# Patient Record
Sex: Male | Born: 1975 | Race: Black or African American | Hispanic: No | Marital: Single | Smoking: Never smoker
Health system: Southern US, Community
[De-identification: ages and names within clinical notes are randomized; demographics above are authoritative.]

## PROBLEM LIST (undated history)

## (undated) DIAGNOSIS — I10 Essential (primary) hypertension: Secondary | ICD-10-CM

---

## 2004-10-14 ENCOUNTER — Emergency Department (HOSPITAL_COMMUNITY): Admission: EM | Admit: 2004-10-14 | Discharge: 2004-10-14 | Payer: Self-pay | Admitting: Emergency Medicine

## 2004-11-03 ENCOUNTER — Emergency Department (HOSPITAL_COMMUNITY): Admission: EM | Admit: 2004-11-03 | Discharge: 2004-11-03 | Payer: Self-pay | Admitting: Emergency Medicine

## 2007-09-11 ENCOUNTER — Emergency Department (HOSPITAL_BASED_OUTPATIENT_CLINIC_OR_DEPARTMENT_OTHER): Admission: EM | Admit: 2007-09-11 | Discharge: 2007-09-11 | Payer: Self-pay | Admitting: Emergency Medicine

## 2007-11-09 ENCOUNTER — Emergency Department (HOSPITAL_BASED_OUTPATIENT_CLINIC_OR_DEPARTMENT_OTHER): Admission: EM | Admit: 2007-11-09 | Discharge: 2007-11-09 | Payer: Self-pay | Admitting: Emergency Medicine

## 2008-05-05 ENCOUNTER — Emergency Department (HOSPITAL_BASED_OUTPATIENT_CLINIC_OR_DEPARTMENT_OTHER): Admission: EM | Admit: 2008-05-05 | Discharge: 2008-05-05 | Payer: Self-pay | Admitting: Emergency Medicine

## 2008-07-07 ENCOUNTER — Emergency Department (HOSPITAL_BASED_OUTPATIENT_CLINIC_OR_DEPARTMENT_OTHER): Admission: EM | Admit: 2008-07-07 | Discharge: 2008-07-07 | Payer: Self-pay | Admitting: Emergency Medicine

## 2009-02-27 ENCOUNTER — Encounter: Payer: Self-pay | Admitting: Emergency Medicine

## 2009-02-27 ENCOUNTER — Ambulatory Visit: Payer: Self-pay | Admitting: Diagnostic Radiology

## 2009-02-27 ENCOUNTER — Ambulatory Visit: Payer: Self-pay | Admitting: Internal Medicine

## 2009-02-27 ENCOUNTER — Emergency Department (HOSPITAL_COMMUNITY): Admission: EM | Admit: 2009-02-27 | Discharge: 2009-02-27 | Payer: Self-pay | Admitting: Emergency Medicine

## 2010-03-25 LAB — POCT CARDIAC MARKERS
CKMB, poc: <1 ng/mL — ABNORMAL LOW (ref 1.0–8.0)
Myoglobin, poc: 79.2 ng/mL (ref 12–200)
Troponin i, poc: <0.05 ng/mL (ref 0.00–0.09)

## 2010-03-25 LAB — CBC
HCT: 44.3 % (ref 39.0–52.0)
Hemoglobin: 15.2 g/dL (ref 13.0–17.0)
MCHC: 34.2 g/dL (ref 30.0–36.0)
MCV: 92.1 fL (ref 78.0–100.0)
Platelets: 215 10*3/uL (ref 150–400)
RBC: 4.82 MIL/uL (ref 4.22–5.81)
RDW: 11.9 % (ref 11.5–15.5)
WBC: 8.7 10*3/uL (ref 4.0–10.5)

## 2010-03-25 LAB — POCT TOXICOLOGY PANEL

## 2010-03-25 LAB — DIFFERENTIAL
Basophils Absolute: 0 10*3/uL (ref 0.0–0.1)
Basophils Relative: 1 % (ref 0–1)
Eosinophils Absolute: 0.2 10*3/uL (ref 0.0–0.7)
Eosinophils Relative: 3 % (ref 0–5)
Lymphocytes Relative: 21 % (ref 12–46)
Lymphs Abs: 1.8 10*3/uL (ref 0.7–4.0)
Monocytes Absolute: 0.5 10*3/uL (ref 0.1–1.0)
Monocytes Relative: 6 % (ref 3–12)
Neutro Abs: 6.2 10*3/uL (ref 1.7–7.7)
Neutrophils Relative %: 69 % (ref 43–77)

## 2010-03-25 LAB — BASIC METABOLIC PANEL
BUN: 10 mg/dL (ref 6–23)
CO2: 32 mEq/L (ref 19–32)
Calcium: 9.3 mg/dL (ref 8.4–10.5)
Chloride: 104 mEq/L (ref 96–112)
Creatinine, Ser: 1 mg/dL (ref 0.4–1.5)
GFR calc Af Amer: >60 mL/min (ref >60)
GFR calc non Af Amer: >60 mL/min (ref >60)
Glucose, Bld: 100 mg/dL — ABNORMAL HIGH (ref 70–99)
Potassium: 4 mEq/L (ref 3.5–5.1)
Sodium: 144 mEq/L (ref 135–145)

## 2010-04-14 LAB — POCT CARDIAC MARKERS
CKMB, poc: <1 ng/mL — ABNORMAL LOW (ref 1.0–8.0)
Myoglobin, poc: 44.3 ng/mL (ref 12–200)
Troponin i, poc: <0.05 ng/mL (ref 0.00–0.09)

## 2010-04-14 LAB — GLUCOSE, CAPILLARY
Glucose-Capillary: 107 mg/dL — ABNORMAL HIGH (ref 70–99)
Glucose-Capillary: 109 mg/dL — ABNORMAL HIGH (ref 70–99)

## 2010-04-14 LAB — DIFFERENTIAL
Basophils Absolute: 0 10*3/uL (ref 0.0–0.1)
Basophils Relative: 1 % (ref 0–1)
Eosinophils Absolute: 0.1 10*3/uL (ref 0.0–0.7)
Eosinophils Relative: 1 % (ref 0–5)
Lymphocytes Relative: 24 % (ref 12–46)
Lymphs Abs: 1.7 10*3/uL (ref 0.7–4.0)
Monocytes Absolute: 0.5 10*3/uL (ref 0.1–1.0)
Monocytes Relative: 7 % (ref 3–12)
Neutro Abs: 4.8 10*3/uL (ref 1.7–7.7)
Neutrophils Relative %: 68 % (ref 43–77)

## 2010-04-14 LAB — CBC
HCT: 44.3 % (ref 39.0–52.0)
Hemoglobin: 14.8 g/dL (ref 13.0–17.0)
MCHC: 33.4 g/dL (ref 30.0–36.0)
MCV: 93.8 fL (ref 78.0–100.0)
Platelets: 164 10*3/uL (ref 150–400)
RBC: 4.73 MIL/uL (ref 4.22–5.81)
RDW: 11.9 % (ref 11.5–15.5)
WBC: 7.1 10*3/uL (ref 4.0–10.5)

## 2010-04-14 LAB — BASIC METABOLIC PANEL
BUN: 22 mg/dL (ref 6–23)
CO2: 27 mEq/L (ref 19–32)
Calcium: 8.8 mg/dL (ref 8.4–10.5)
Chloride: 105 mEq/L (ref 96–112)
Creatinine, Ser: 1 mg/dL (ref 0.4–1.5)
GFR calc Af Amer: >60 mL/min (ref >60)
GFR calc non Af Amer: >60 mL/min (ref >60)
Glucose, Bld: 95 mg/dL (ref 70–99)
Potassium: 4.7 mEq/L (ref 3.5–5.1)
Sodium: 140 mEq/L (ref 135–145)

## 2013-02-13 ENCOUNTER — Encounter (HOSPITAL_BASED_OUTPATIENT_CLINIC_OR_DEPARTMENT_OTHER): Payer: Self-pay | Admitting: Emergency Medicine

## 2013-02-13 ENCOUNTER — Emergency Department (HOSPITAL_BASED_OUTPATIENT_CLINIC_OR_DEPARTMENT_OTHER): Payer: BC Managed Care – PPO

## 2013-02-13 ENCOUNTER — Emergency Department (HOSPITAL_BASED_OUTPATIENT_CLINIC_OR_DEPARTMENT_OTHER)
Admission: EM | Admit: 2013-02-13 | Discharge: 2013-02-13 | Disposition: A | Discharge status: Home / Self Care | Payer: BC Managed Care – PPO | Attending: Emergency Medicine | Admitting: Emergency Medicine

## 2013-02-13 DIAGNOSIS — R42 Dizziness and giddiness: Secondary | ICD-10-CM | POA: Insufficient documentation

## 2013-02-13 DIAGNOSIS — G43009 Migraine without aura, not intractable, without status migrainosus: Secondary | ICD-10-CM

## 2013-02-13 DIAGNOSIS — R209 Unspecified disturbances of skin sensation: Secondary | ICD-10-CM | POA: Insufficient documentation

## 2013-02-13 DIAGNOSIS — G43809 Other migraine, not intractable, without status migrainosus: Secondary | ICD-10-CM | POA: Insufficient documentation

## 2013-02-13 MED ORDER — DEXAMETHASONE SODIUM PHOSPHATE 10 MG/ML IJ SOLN
10.0000 mg | Freq: Once | INTRAMUSCULAR | Status: AC
  Administered 2013-02-13: 10 mg via INTRAVENOUS
  Filled 2013-02-13: qty 1

## 2013-02-13 MED ORDER — DIPHENHYDRAMINE HCL 50 MG/ML IJ SOLN
25.0000 mg | Freq: Once | INTRAMUSCULAR | Status: AC
  Administered 2013-02-13: 25 mg via INTRAVENOUS
  Filled 2013-02-13: qty 1

## 2013-02-13 MED ORDER — METOCLOPRAMIDE HCL 5 MG/ML IJ SOLN
10.0000 mg | Freq: Once | INTRAMUSCULAR | Status: AC
  Administered 2013-02-13: 10 mg via INTRAVENOUS
  Filled 2013-02-13: qty 2

## 2013-02-13 MED ORDER — TETRACAINE HCL 0.5 % OP SOLN
1.0000 [drp] | Freq: Once | OPHTHALMIC | Status: AC
  Administered 2013-02-13: 2 [drp] via OPHTHALMIC
  Filled 2013-02-13: qty 2

## 2013-02-13 NOTE — ED Notes (Signed)
Patient transported to CT 

## 2013-02-13 NOTE — ED Provider Notes (Signed)
CSN: [id number]     Arrival date & time 02/13/13  1916 History   First MD Initiated Contact with Patient 02/13/13 1931     Chief Complaint  Patient presents with  . Headache     (Consider location/radiation/quality/duration/timing/severity/associated sxs/prior Treatment) HPI Comments: Patient with no PMHx presents today with complaints of numbness to the right side of his head, dizziness with standing and turning his head, right eye pain and pressure, and pressure in his head.  He also reports halos around lights in his right eye only.  He denies fever, chills, neck stiffness, nausea, vomiting, neck pain, chest pain, shortness of breath, focal weakness, numbness or tingling in his extremities, dysuria, recent URI.  Patient is a 38 y.o. male presenting with headaches. The history is provided by the patient. No language interpreter was used.  Headache Associated symptoms: dizziness and numbness     History reviewed. No pertinent past medical history. History reviewed. No pertinent past surgical history. History reviewed. No pertinent family history. History  Substance Use Topics  . Smoking status: Never Smoker   . Smokeless tobacco: Not on file  . Alcohol Use: No    Review of Systems  Neurological: Positive for dizziness, numbness and headaches. Negative for syncope, facial asymmetry, speech difficulty and weakness.  All other systems reviewed and are negative.      Allergies  Review of patient's allergies indicates no known allergies.  Home Medications  No current outpatient prescriptions on file. BP 130/78  Pulse 66  Temp(Src) 98.6 F (37 C) (Oral)  Resp 16  Ht 6' (1.829 m)  Wt 212 lb (96.163 kg)  BMI 28.75 kg/m2  SpO2 99% Physical Exam  Nursing note and vitals reviewed. Constitutional: He is oriented to person, place, and time. He appears well-developed and well-nourished. No distress.  HENT:  Head: Normocephalic and atraumatic.  Right Ear: External ear normal.   Left Ear: External ear normal.  Nose: Nose normal.  Mouth/Throat: Oropharynx is clear and moist. No oropharyngeal exudate.  Reports decrease in sensation to right scalp, face.  Eyes: Conjunctivae and EOM are normal. Pupils are equal, round, and reactive to light. Right eye exhibits no discharge. Left eye exhibits no discharge. No scleral icterus.  IOP in right eye 8 and 9, no proptosis, no ptosis  Neck: Normal range of motion. Neck supple.  Cardiovascular: Normal rate, regular rhythm and normal heart sounds.  Exam reveals no gallop and no friction rub.   No murmur heard. Pulmonary/Chest: Effort normal and breath sounds normal. No respiratory distress. He has no wheezes. He has no rales. He exhibits no tenderness.  Abdominal: Soft. Bowel sounds are normal. He exhibits no distension. There is no tenderness.  Musculoskeletal: Normal range of motion. He exhibits no edema and no tenderness.  Lymphadenopathy:    He has no cervical adenopathy.  Neurological: He is alert and oriented to person, place, and time. He has normal strength. He displays no atrophy. A sensory deficit is present. No cranial nerve deficit. He exhibits normal muscle tone. He displays a negative Romberg sign. Coordination and gait normal. GCS eye subscore is 4. GCS verbal subscore is 5. GCS motor subscore is 6.  Reflex Scores:      Tricep reflexes are 2+ on the right side and 2+ on the left side.      Bicep reflexes are 2+ on the right side and 2+ on the left side.      Patellar reflexes are 2+ on the right side and 2+  on the left side.      Achilles reflexes are 2+ on the right side and 2+ on the left side. Skin: Skin is warm and dry. No rash noted. No erythema. No pallor.  Psychiatric: He has a normal mood and affect. His behavior is normal. Judgment and thought content normal.    ED Course  Procedures (including critical care time) Labs Review Labs Reviewed - No data to display Imaging Review Ct Head Wo  Contrast  02/13/2013   CLINICAL DATA:  Headache and dizziness.  EXAM: CT HEAD WITHOUT CONTRAST  TECHNIQUE: Contiguous axial images were obtained from the base of the skull through the vertex without intravenous contrast.  COMPARISON:  Head CT scan 11/09/2007.  FINDINGS: The brain appears normal without infarct, hemorrhage, mass lesion, mass effect, midline shift or abnormal extra-axial fluid collection. No hydrocephalus or pneumocephalus. The calvarium is intact.  IMPRESSION: Negative head CT.   Electronically Signed   By: Drusilla Kanner M.D.   On: 02/13/2013 20:46    EKG Interpretation   None      Medications  tetracaine (PONTOCAINE) 0.5 % ophthalmic solution 1-2 drop (2 drops Right Eye Given 02/13/13 1952)  metoCLOPramide (REGLAN) injection 10 mg (10 mg Intravenous Given 02/13/13 2117)  diphenhydrAMINE (BENADRYL) injection 25 mg (25 mg Intravenous Given 02/13/13 2115)  dexamethasone (DECADRON) injection 10 mg (10 mg Intravenous Given 02/13/13 2120)     MDM   Final diagnoses:  Atypical migraine    Patient here with normal head ct and neuro examination (besides subjective sensory issues to his head), symptoms completely relieved with a migraine cocktail so I suspect this may actually be an atypical migraine.  Patient will follow up with his PCP.  Initially slightly hypertensive, but repeat blood pressure 138/90   Scarlette Calico C. Marisue Humble, PA-C 02/13/13 2206

## 2013-02-13 NOTE — ED Notes (Addendum)
Dizziness, rt side head pressure,  Pt states feels like rt eye droop, pt also states feels as if words slurring  Dizziness when standing   Grips equal bilateral,  No facial droop noted,  Good hand eye movement   Denies inj   rn assessment normal

## 2013-02-13 NOTE — ED Notes (Signed)
Pt reports h/a with  Dizziness x 1 day also c/o right facial numbness

## 2013-02-13 NOTE — ED Provider Notes (Signed)
Medical screening examination/treatment/procedure(s) were performed by non-physician practitioner and as supervising physician I was immediately available for consultation/collaboration.  EKG Interpretation   None         Gwyneth SproutWhitney Havyn Ramo, MD 02/13/13 2233

## 2013-02-13 NOTE — Discharge Instructions (Signed)
Migraine Headache A migraine headache is an intense, throbbing pain on one or both sides of your head. A migraine can last for 30 minutes to several hours. CAUSES  The exact cause of a migraine headache is not always known. However, a migraine may be caused when nerves in the brain become irritated and release chemicals that cause inflammation. This causes pain. Certain things may also trigger migraines, such as:  Alcohol.  Smoking.  Stress.  Menstruation.  Aged cheeses.  Foods or drinks that contain nitrates, glutamate, aspartame, or tyramine.  Lack of sleep.  Chocolate.  Caffeine.  Hunger.  Physical exertion.  Fatigue.  Medicines used to treat chest pain (nitroglycerine), birth control pills, estrogen, and some blood pressure medicines. SIGNS AND SYMPTOMS  Pain on one or both sides of your head.  Pulsating or throbbing pain.  Severe pain that prevents daily activities.  Pain that is aggravated by any physical activity.  Nausea, vomiting, or both.  Dizziness.  Pain with exposure to bright lights, loud noises, or activity.  General sensitivity to bright lights, loud noises, or smells. Before you get a migraine, you may get warning signs that a migraine is coming (aura). An aura may include:  Seeing flashing lights.  Seeing bright spots, halos, or zig-zag lines.  Having tunnel vision or blurred vision.  Having feelings of numbness or tingling.  Having trouble talking.  Having muscle weakness. DIAGNOSIS  A migraine headache is often diagnosed based on:  Symptoms.  Physical exam.  A CT scan or MRI of your head. These imaging tests cannot diagnose migraines, but they can help rule out other causes of headaches. TREATMENT Medicines may be given for pain and nausea. Medicines can also be given to help prevent recurrent migraines.  HOME CARE INSTRUCTIONS  Only take over-the-counter or prescription medicines for pain or discomfort as directed by your  health care provider. The use of long-term narcotics is not recommended.  Lie down in a dark, quiet room when you have a migraine.  Keep a journal to find out what may trigger your migraine headaches. For example, write down:  What you eat and drink.  How much sleep you get.  Any change to your diet or medicines.  Limit alcohol consumption.  Quit smoking if you smoke.  Get 7 9 hours of sleep, or as recommended by your health care provider.  Limit stress.  Keep lights dim if bright lights bother you and make your migraines worse. SEEK IMMEDIATE MEDICAL CARE IF:   Your migraine becomes severe.  You have a fever.  You have a stiff neck.  You have vision loss.  You have muscular weakness or loss of muscle control.  You start losing your balance or have trouble walking.  You feel faint or pass out.  You have severe symptoms that are different from your first symptoms. MAKE SURE YOU:   Understand these instructions.  Will watch your condition.  Will get help right away if you are not doing well or get worse. Document Released: 12/21/2004 Document Revised: 10/11/2012 Document Reviewed: 08/28/2012 [facility] Patient Information 2014 [place], [place].  Abdominal Migraine Abdominal migraine is one of several types of migraine. It is one example of "periodic syndrome". The periodic type more commonly occurs in children. Such children usually have a family history of migraine. Children may go on to develop typical migraines later in their lives.  SYMPTOMS  The attacks usually include intermittent periods of abdominal pain. Along with the abdominal pain, other symptoms may  occur such as:  Nausea.  Vomiting.  Intense blushing or reddening of the skin (flushing).  Pale appearance to the skin (pallor). DIAGNOSIS  Tests may be done to look for other possible conditions. TREATMENT  Medications that are useful in treating migraine may also work to control these attacks in  most children. Document Released: 03/13/2003 Document Revised: 04/17/2012 Document Reviewed: 08/09/2007 [facility] Patient Information 2014 [place], [place].

## 2013-02-13 NOTE — ED Notes (Signed)
Patient transported to X-ray 

## 2015-07-20 IMAGING — CT CT HEAD W/O CM
1 series · 16 of 30 positions shown, 20 images · non-contrast
Comparison: Head CT scan 11/09/2007.

CLINICAL DATA: Headache and dizziness.

EXAM:
CT HEAD WITHOUT CONTRAST
TECHNIQUE: Contiguous axial images were obtained from the base of the skull
through the vertex without intravenous contrast.

[Series 2: head 4.8 h37s · axial · 0.44mm/px · z∈[-146,-4]mm · 16 of 34 slices shown, 20 images]
[im 2/34  brain]
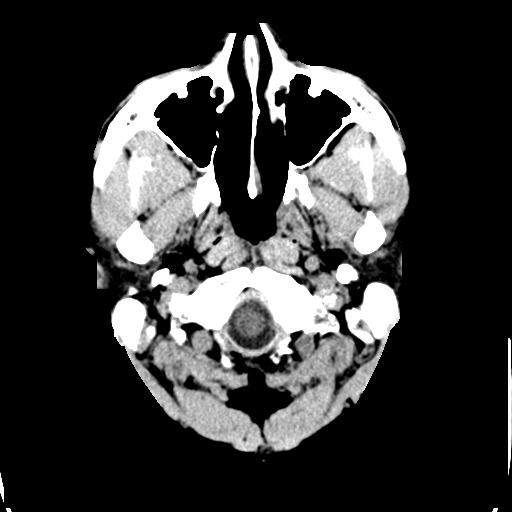
[im 2/34  bone]
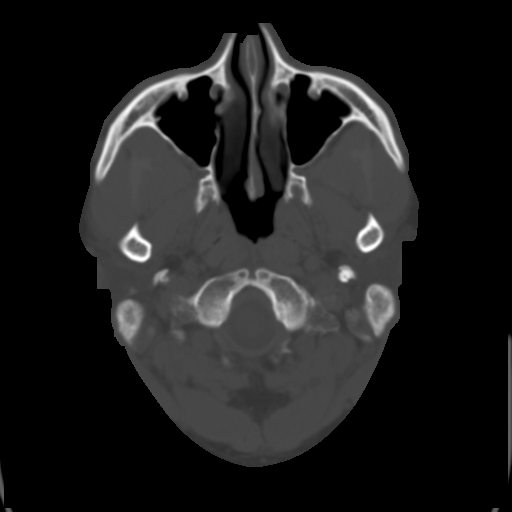
[im 4/34  brain]
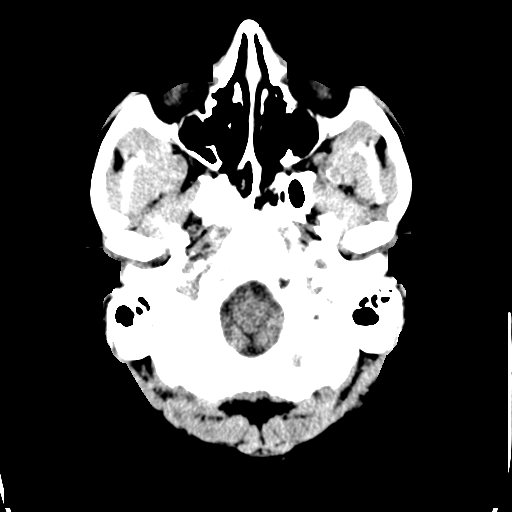
[im 6/34  brain]
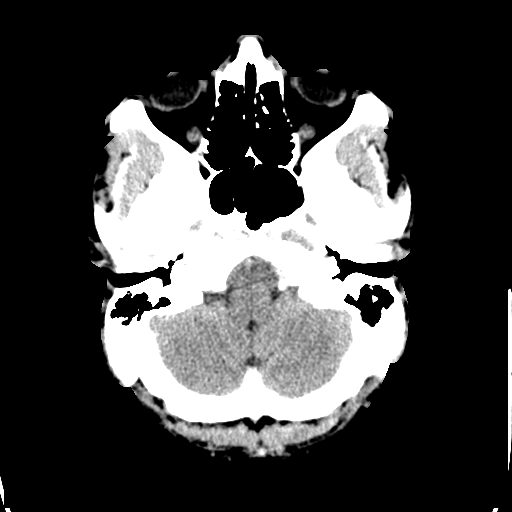
[im 8/34  brain]
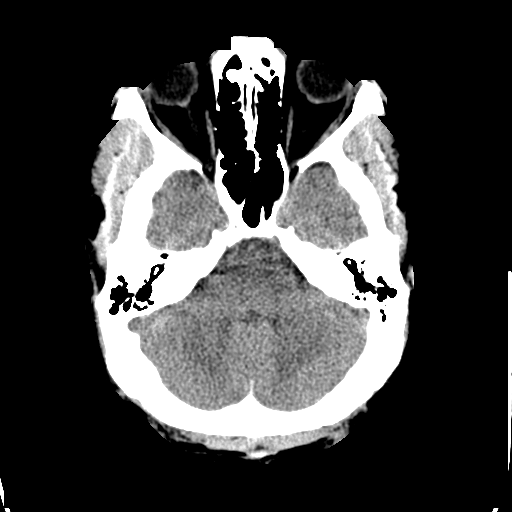
[im 10/34  brain]
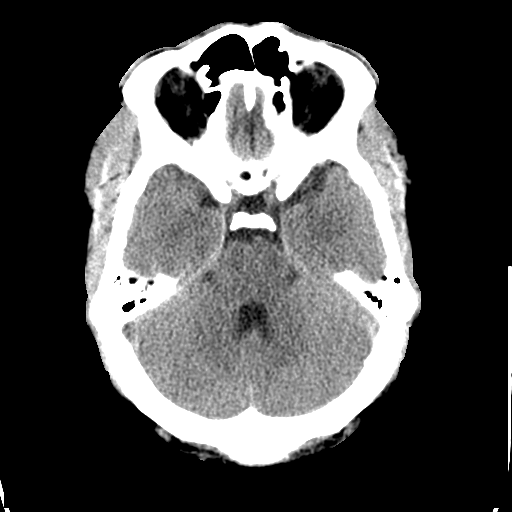
[im 10/34  bone]
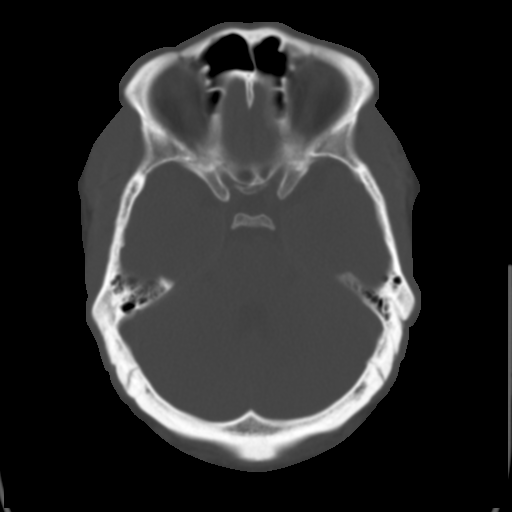
[im 12/34  brain]
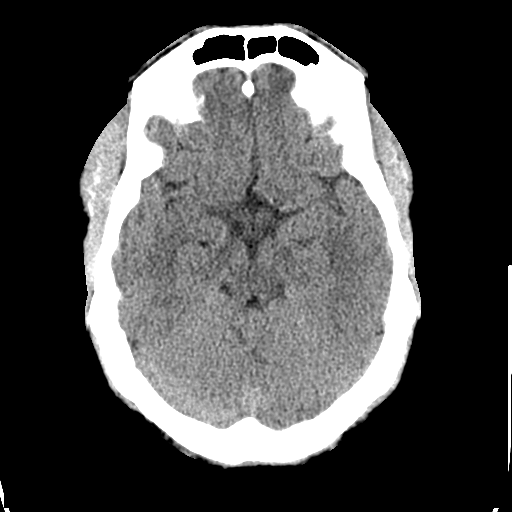
[im 14/34  brain]
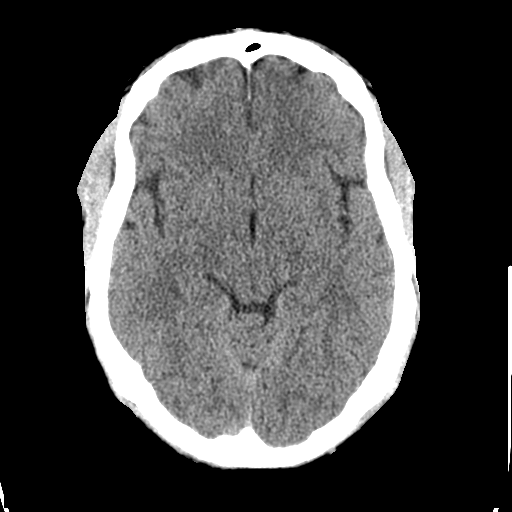
[im 16/34  brain]
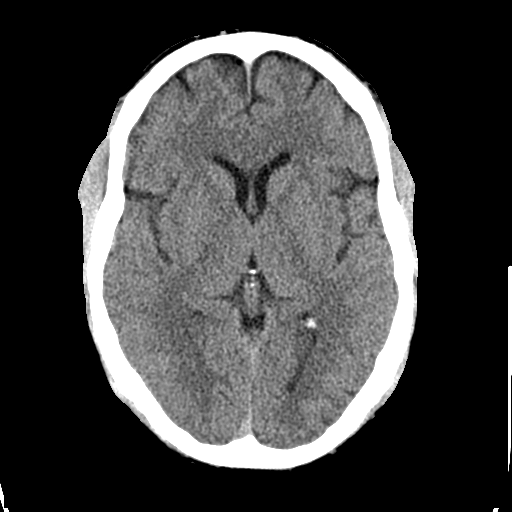
[im 18/34  brain]
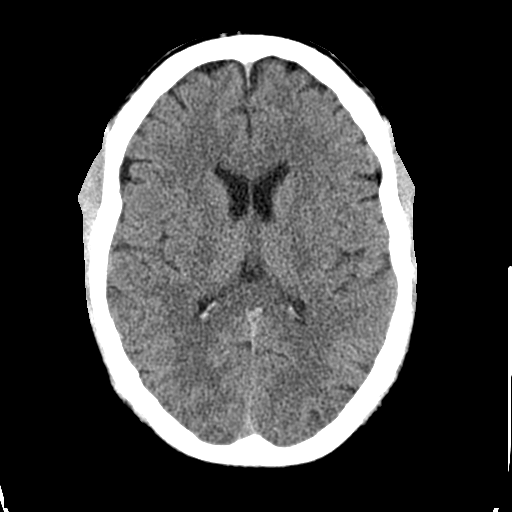
[im 18/34  bone]
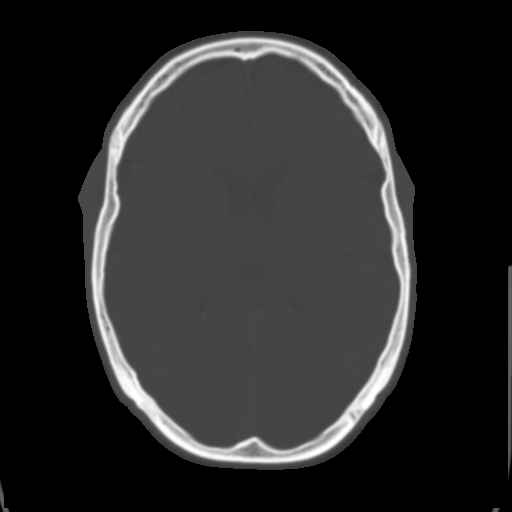
[im 20/34  brain]
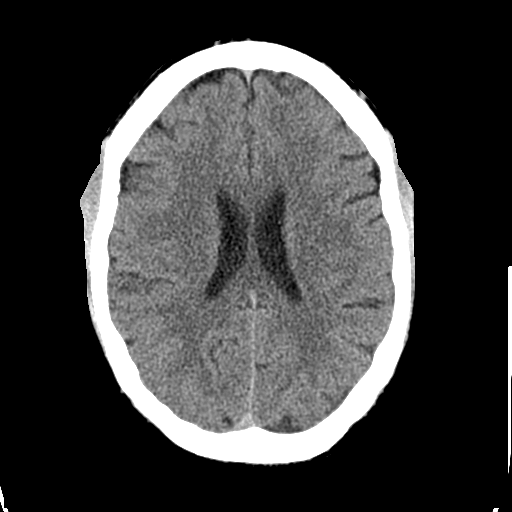
[im 22/34  brain]
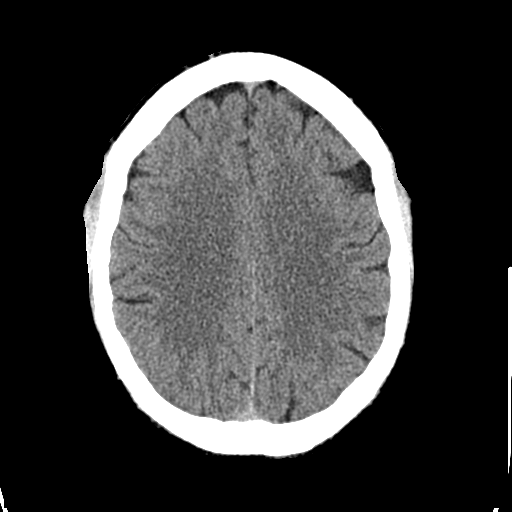
[im 24/34  brain]
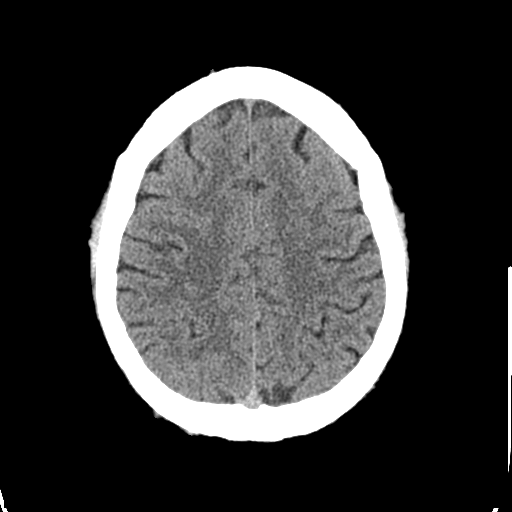
[im 26/34  brain]
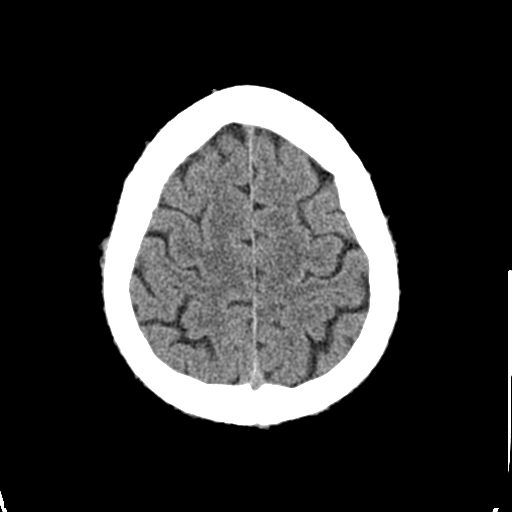
[im 26/34  bone]
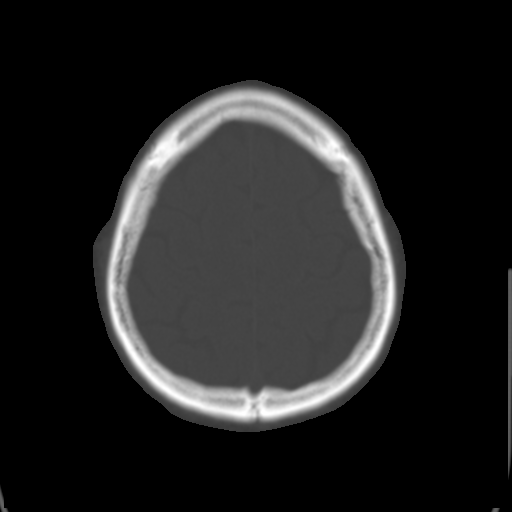
[im 28/34  brain]
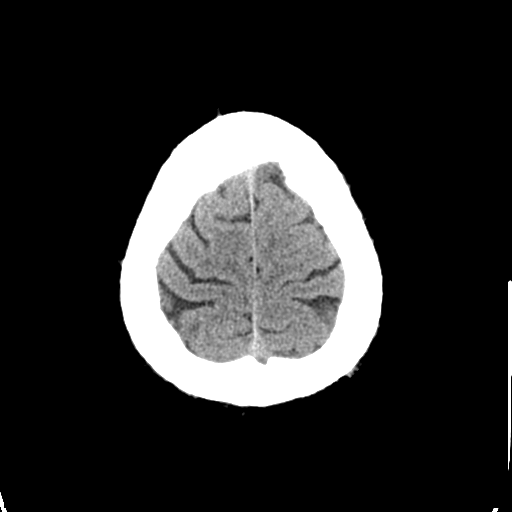
[im 30/34  brain]
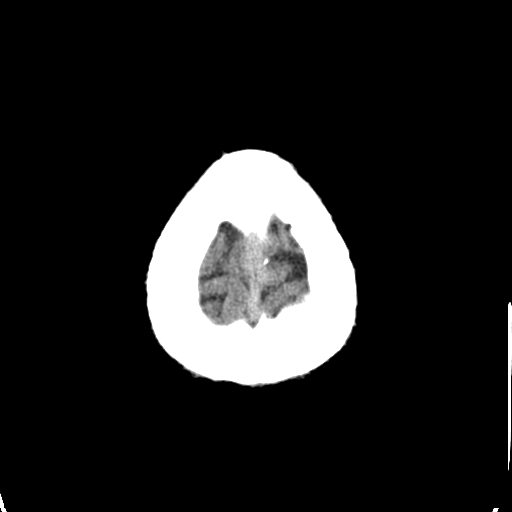
[im 32/34  brain]
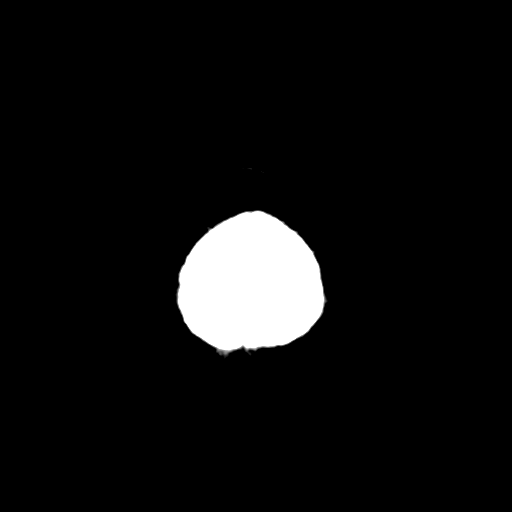

[16 of 30 positions shown; findings below may reference images not displayed]

FINDINGS: The brain appears normal without infarct, hemorrhage, mass lesion,
mass effect, midline shift or abnormal extra-axial fluid collection.
No hydrocephalus or pneumocephalus. The calvarium is intact.
IMPRESSION: Negative head CT.

## 2020-01-29 ENCOUNTER — Encounter (HOSPITAL_BASED_OUTPATIENT_CLINIC_OR_DEPARTMENT_OTHER): Payer: Self-pay | Admitting: *Deleted

## 2020-01-29 ENCOUNTER — Other Ambulatory Visit: Payer: Self-pay

## 2020-01-29 ENCOUNTER — Emergency Department (HOSPITAL_BASED_OUTPATIENT_CLINIC_OR_DEPARTMENT_OTHER): Payer: Managed Care, Other (non HMO)

## 2020-01-29 ENCOUNTER — Emergency Department (HOSPITAL_BASED_OUTPATIENT_CLINIC_OR_DEPARTMENT_OTHER)
Admission: EM | Admit: 2020-01-29 | Discharge: 2020-01-29 | Disposition: A | Discharge status: Home / Self Care | Payer: Managed Care, Other (non HMO) | Attending: Emergency Medicine | Admitting: Emergency Medicine

## 2020-01-29 DIAGNOSIS — I10 Essential (primary) hypertension: Secondary | ICD-10-CM | POA: Insufficient documentation

## 2020-01-29 DIAGNOSIS — Z79899 Other long term (current) drug therapy: Secondary | ICD-10-CM | POA: Insufficient documentation

## 2020-01-29 DIAGNOSIS — R079 Chest pain, unspecified: Secondary | ICD-10-CM | POA: Insufficient documentation

## 2020-01-29 HISTORY — DX: Essential (primary) hypertension: I10

## 2020-01-29 LAB — BASIC METABOLIC PANEL WITH GFR
Anion gap: 10 (ref 5–15)
BUN: 17 mg/dL (ref 6–20)
CO2: 21 mmol/L — ABNORMAL LOW (ref 22–32)
Calcium: 9 mg/dL (ref 8.9–10.3)
Chloride: 104 mmol/L (ref 98–111)
Creatinine, Ser: 1.08 mg/dL (ref 0.61–1.24)
GFR, Estimated: >60 mL/min
Glucose, Bld: 110 mg/dL — ABNORMAL HIGH (ref 70–99)
Potassium: 3.8 mmol/L (ref 3.5–5.1)
Sodium: 135 mmol/L (ref 135–145)

## 2020-01-29 LAB — TROPONIN I (HIGH SENSITIVITY)
Troponin I (High Sensitivity): 2 ng/L (ref <18)
Troponin I (High Sensitivity): 3 ng/L

## 2020-01-29 LAB — CBC
HCT: 39.7 % (ref 39.0–52.0)
Hemoglobin: 13.9 g/dL (ref 13.0–17.0)
MCH: 30.6 pg (ref 26.0–34.0)
MCHC: 35 g/dL (ref 30.0–36.0)
MCV: 87.4 fL (ref 80.0–100.0)
Platelets: 191 10*3/uL (ref 150–400)
RBC: 4.54 MIL/uL (ref 4.22–5.81)
RDW: 12 % (ref 11.5–15.5)
WBC: 6.8 10*3/uL (ref 4.0–10.5)
nRBC: 0 % (ref 0.0–0.2)

## 2020-01-29 LAB — D-DIMER, QUANTITATIVE (NOT AT ARMC): D-Dimer, Quant: <0.27 ug/mL-FEU (ref 0.00–0.50)

## 2020-01-29 MED ORDER — KETOROLAC TROMETHAMINE 30 MG/ML IJ SOLN
30.0000 mg | Freq: Once | INTRAMUSCULAR | Status: AC
  Administered 2020-01-29: 30 mg via INTRAVENOUS
  Filled 2020-01-29: qty 1

## 2020-01-29 NOTE — ED Provider Notes (Signed)
[facility] EMERGENCY DEPARTMENT Provider Note   CSN: [id number] Arrival date & time: 01/29/20  1244     History Chief Complaint  Patient presents with  . Chest Pain    Kyle Gallagher is a 45 y.o. male.  Kyle Gallagher is a 45 y.o. male with a history of hypertension, who presents to the ED for evaluation of chest pain.  He reports that this afternoon while at work he had a sudden onset of pain in the left side of his chest that radiated around to his left shoulder blade.  He reports pain has been constant, is worse with movement or deep breath.  He has had some chest pains previously but have not felt similar to this.  He reports when chest pain for started he felt short of breath but no longer feels this.  Denies any associated lightheadedness or syncope.  No lower extremity swelling or pain.  No tearing sensation or pain going down his back or into his abdomen.  No radiation to the arm neck or jaw.  No numbness weakness or tingling in extremities.  No associated nausea or vomiting.  No palpitations.  He has not taken anything for this pain.  Reports he recently saw his cardiologist, Dr. Ivan Croft with [facility] last week, and they have scheduled him for a stress echo because he has been having some dyspnea on exertion and palpitations, reports the pain he experienced today was different than the symptoms.  No history of smoking or other alcohol or drug use.  History of hypertension but no other cardiac risk factors.  No other aggravating or alleviating factors.        Past Medical History:  Diagnosis Date  . Hypertension     There are no problems to display for this patient.   History reviewed. No pertinent surgical history.     No family history on file.  Social History   Tobacco Use  . Smoking status: Never Smoker  . Smokeless tobacco: Never Used  Substance Use Topics  . Alcohol use: No  . Drug use: No    Home Medications Prior to Admission medications    Medication Sig Start Date End Date Taking? Authorizing Provider  losartan (COZAAR) 25 MG tablet Take by mouth.   Yes [provider]    Allergies    Patient has no known allergies.  Review of Systems   Review of Systems  Constitutional: Negative for chills and fever.  HENT: Negative.   Respiratory: Negative for cough and shortness of breath.   Cardiovascular: Positive for chest pain.  Gastrointestinal: Negative for abdominal pain, nausea and vomiting.  Musculoskeletal: Negative for arthralgias and myalgias.  Skin: Negative for color change and rash.  Neurological: Negative for dizziness, syncope and light-headedness.  All other systems reviewed and are negative.   Physical Exam Updated Vital Signs BP 126/90 (BP Location: Left Arm)   Pulse 67   Temp 98.7 F (37.1 C) (Oral)   Resp 18   Ht 6\' 1"  (1.854 m)   Wt 97.4 kg   SpO2 99%   BMI 28.33 kg/m   Physical Exam Vitals and nursing note reviewed.  Constitutional:      General: He is not in acute distress.    Appearance: He is well-developed, normal weight and well-nourished. He is not ill-appearing or diaphoretic.     Comments: Well-appearing and in no distress  HENT:     Head: Normocephalic and atraumatic.     Mouth/Throat:  Mouth: Oropharynx is clear and moist.  Eyes:     General:        Right eye: No discharge.        Left eye: No discharge.     Extraocular Movements: EOM normal.     Pupils: Pupils are equal, round, and reactive to light.  Cardiovascular:     Rate and Rhythm: Normal rate and regular rhythm.     Pulses: Intact distal pulses.          Radial pulses are 2+ on the right side and 2+ on the left side.       Dorsalis pedis pulses are 2+ on the right side and 2+ on the left side.       Posterior tibial pulses are 2+ on the right side and 2+ on the left side.     Heart sounds: Normal heart sounds. No murmur heard. No friction rub. No gallop.   Pulmonary:     Effort: Pulmonary effort is  normal. No respiratory distress.     Breath sounds: Normal breath sounds. No wheezing or rales.     Comments: Respirations equal and unlabored, patient able to speak in full sentences, lungs clear to auscultation bilaterally  Chest:     Chest wall: Tenderness present.  Abdominal:     General: Bowel sounds are normal. There is no distension.     Palpations: Abdomen is soft. There is no mass.     Tenderness: There is no abdominal tenderness. There is no guarding.     Comments: Abdomen soft, nondistended, nontender to palpation in all quadrants without guarding or peritoneal signs  Musculoskeletal:        General: No deformity or edema.     Cervical back: Neck supple.     Right lower leg: No tenderness. No edema.     Left lower leg: No tenderness. No edema.  Skin:    General: Skin is warm and dry.     Capillary Refill: Capillary refill takes less than 2 seconds.  Neurological:     Mental Status: He is alert.     Coordination: Coordination normal.     Comments: Speech is clear, able to follow commands Moves extremities without ataxia, coordination intact  Psychiatric:        Mood and Affect: Mood normal.        Behavior: Behavior normal.     ED Results / Procedures / Treatments   Labs (all labs ordered are listed, but only abnormal results are displayed) Labs Reviewed  BASIC METABOLIC PANEL - Abnormal; Notable for the following components:      Result Value   CO2 21 (*)    Glucose, Bld 110 (*)    All other components within normal limits  CBC  D-DIMER, QUANTITATIVE (NOT AT [facility])  TROPONIN I (HIGH SENSITIVITY)  TROPONIN I (HIGH SENSITIVITY)    EKG EKG Interpretation  Date/Time:  Tuesday January 29 2020 12:43:17 EST Ventricular Rate:  80 PR Interval:  162 QRS Duration: 84 QT Interval:  342 QTC Calculation: 394 R Axis:   32 Text Interpretation: Normal sinus rhythm Normal ECG No significant change was found Confirmed by Glynn Octave [phone]) on 01/29/2020 7:32:07  PM   Radiology DG Chest 2 View  Result Date: 01/29/2020 CLINICAL DATA:  Chest pain EXAM: CHEST - 2 VIEW COMPARISON:  February 27, 2009 FINDINGS: Lungs are clear. Heart size and pulmonary vascularity are normal. No adenopathy. No pneumothorax. No bone lesions. IMPRESSION: Lungs clear.  Heart  size normal. Electronically Signed   By: Bretta Bang III M.D.   On: 01/29/2020 13:03    Procedures Procedures   Medications Ordered in ED Medications  ketorolac (TORADOL) 30 MG/ML injection 30 mg (30 mg Intravenous Given 01/29/20 2147)    ED Course  I have reviewed the triage vital signs and the nursing notes.  Pertinent labs & imaging results that were available during my care of the patient were reviewed by me and considered in my medical decision making (see chart for details).    MDM Rules/Calculators/A&P                         Patient presents to the emergency department with chest pain. Patient nontoxic appearing, in no apparent distress, vitals without significant abnormality. Fairly benign physical exam.   Patient's primary cardiologist: Dr. Ivan Croft with [facility], has stress echo scheduled next month  DDX: ACS, pulmonary embolism, dissection, pneumothorax, pneumonia, arrhythmia, severe anemia, MSK, GERD, anxiety. Evaluation initiated with labs, EKG, and CXR. Patient on cardiac monitor.   I have independently ordered, reviewed and interpreted all labs and imaging:  CBC: No leukocytosis, normal hemoglobin BMP: CO2 of 21, glucose 110, no other electrolyte derangements and normal renal function Troponin: Negative x2 D-dimer: Negative EKG: Sinus rhythm, some mild ST changes in the inferior lateral leads, thought to likely be early repole, unchanged from prior EKGs CXR:  Negative, without infiltrate, effusion, pneumothorax, or fracture/dislocation.   Heart Pathway Score 3 - EKG without obvious acute ischemia, delta troponin negative, doubt ACS. D-dimer is negative, doubt  pulmonary embolism. Pain is not a tearing sensation, symmetric pulses, no widening of mediastinum on CXR, doubt dissection. Cardiac monitor reviewed, no notable arrhythmias or tachycardia. Patient has appeared hemodynamically stable throughout ER visit and appears safe for discharge with close PCP/cardiology follow up. I discussed results, treatment plan, need for PCP follow-up, and return precautions with the patient. Provided opportunity for questions, patient confirmed understanding and is in agreement with plan.    Final Clinical Impression(s) / ED Diagnoses Final diagnoses:  Left-sided chest pain    Rx / DC Orders ED Discharge Orders    None       Legrand Rams 01/29/20 2233    Glynn Octave, MD 01/30/20 1005

## 2020-01-29 NOTE — ED Notes (Signed)
Patient verbalizes understanding of discharge instructions. Opportunity for questioning and answers were provided. Armband removed by staff, pt discharged from ED ambulatory to home.  

## 2020-01-29 NOTE — Discharge Instructions (Signed)
You were seen in the emergency department today for chest pain. Your work-up in the emergency department has been overall reassuring. Your labs have been fairly normal and or similar to previous blood work you have had done. Your EKG and the enzyme we use to check your heart did not show an acute heart attack at this time. Your chest x-ray was normal.   Pain may be musculoskeletal in nature, you can use ibuprofen, Tylenol or Aleve to help with symptoms.  We would like you to follow up closely with your primary care provider and/or the cardiologist provided in your discharge instructions within 1-3 days. Return to the ER immediately should you experience any new or worsening symptoms including but not limited to return of pain, worsened pain, vomiting, shortness of breath, dizziness, lightheadedness, passing out, or any other concerns that you may have.

## 2020-01-29 NOTE — ED Triage Notes (Signed)
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 Pain in sharp in his left scapula with radiation anterior left chest.

## 2022-07-04 IMAGING — DX DG CHEST 2V
2 series · 2 of 2 positions shown · non-contrast
Comparison: February 27, 2009

CLINICAL DATA: Chest pain

EXAM:
CHEST - 2 VIEW

[chest pa]
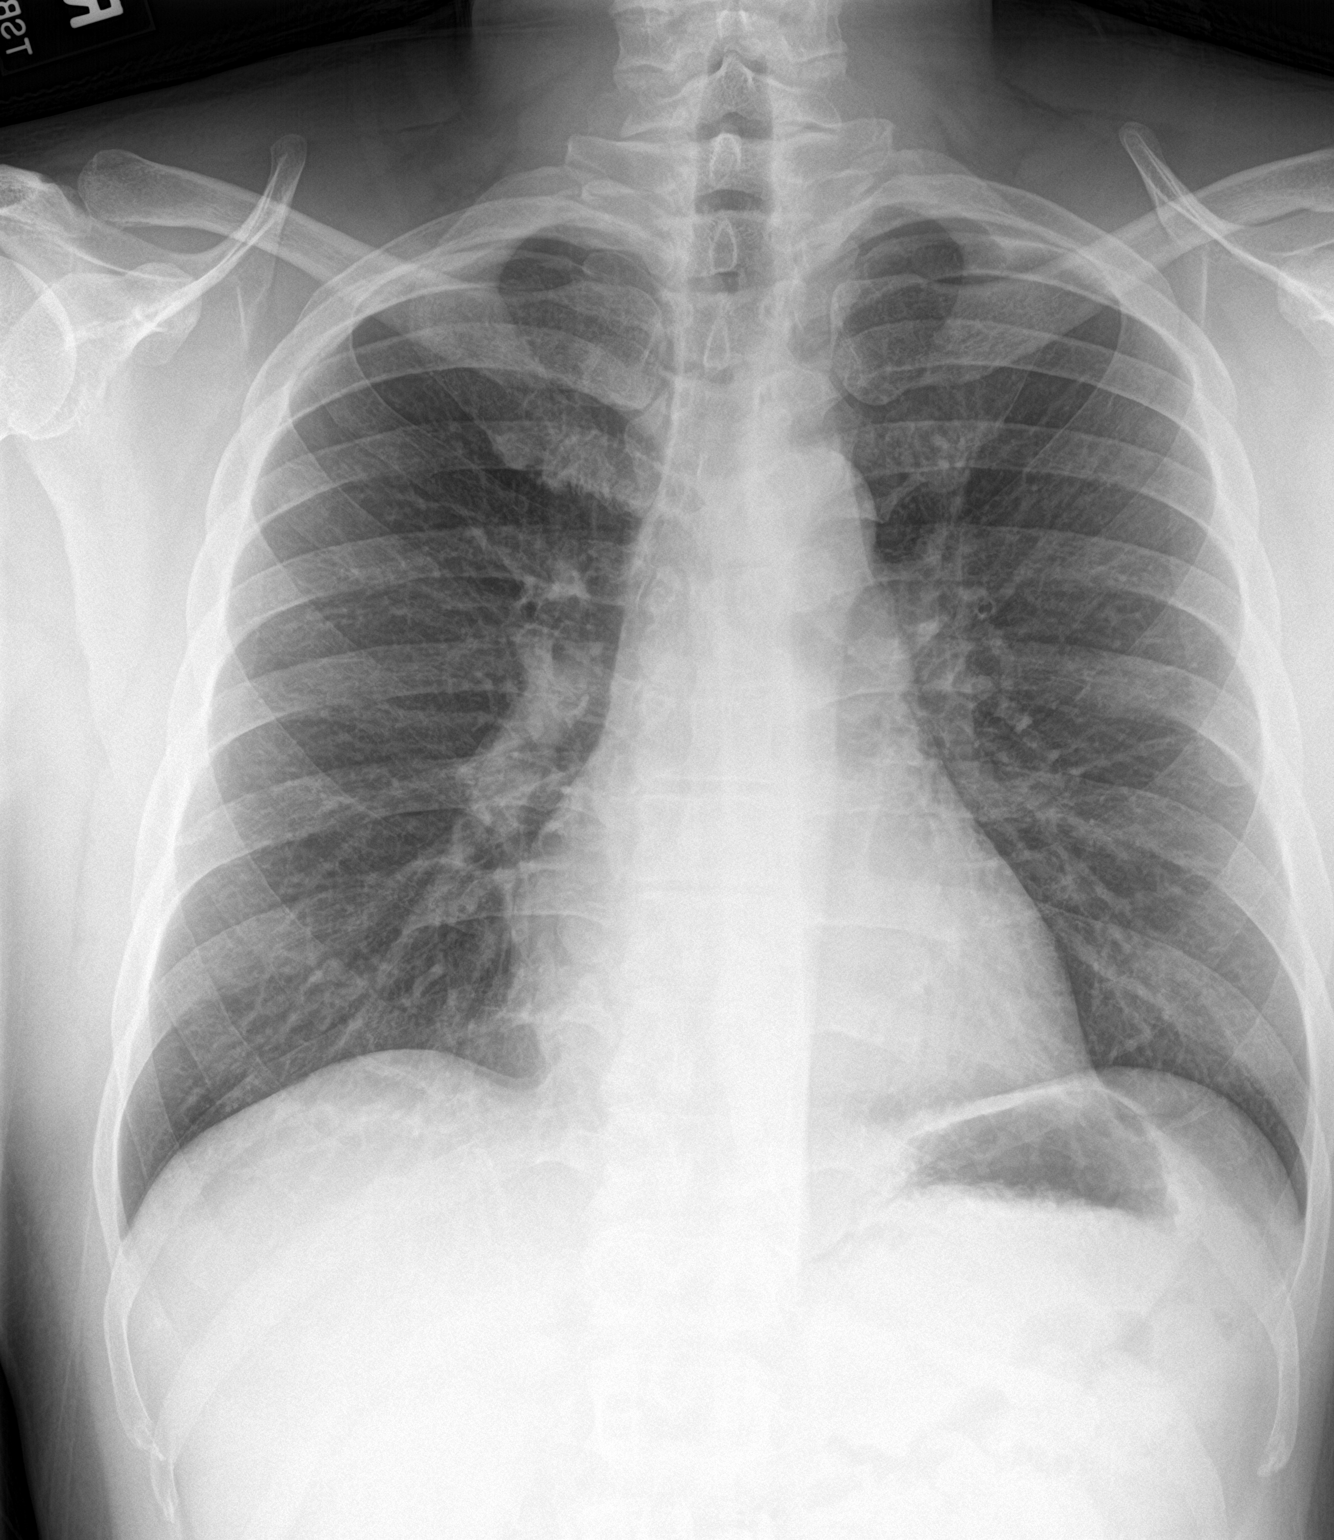

[chest lat]
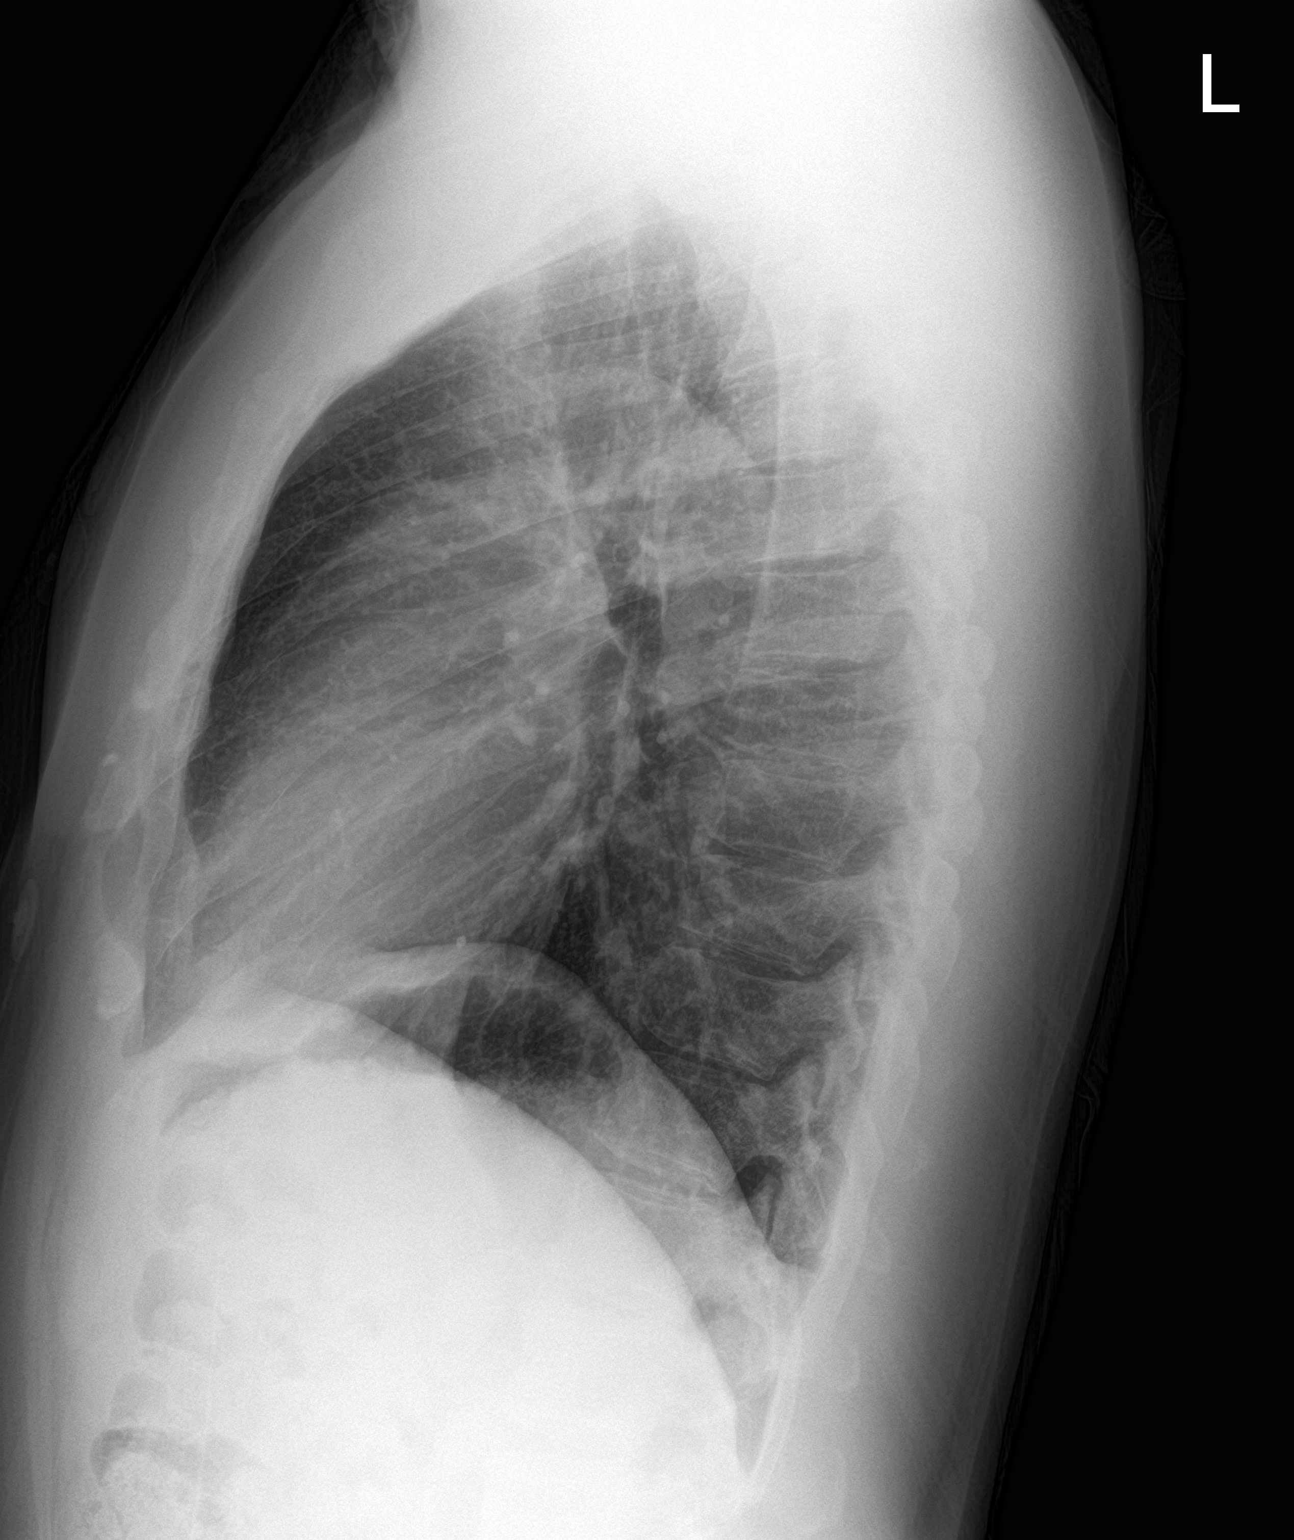

[2 of 2 positions shown; findings below may reference images not displayed]

FINDINGS: Lungs are clear. Heart size and pulmonary vascularity are normal. No
adenopathy. No pneumothorax. No bone lesions.
IMPRESSION: Lungs clear.  Heart size normal.
